# Patient Record
Sex: Male | Born: 1975 | Race: White | Hispanic: No | Marital: Married | Smoking: Former smoker
Health system: Southern US, Community
[De-identification: ages and names within clinical notes are randomized; demographics above are authoritative.]

## PROBLEM LIST (undated history)

## (undated) DIAGNOSIS — N289 Disorder of kidney and ureter, unspecified: Secondary | ICD-10-CM

## (undated) DIAGNOSIS — F419 Anxiety disorder, unspecified: Secondary | ICD-10-CM

## (undated) DIAGNOSIS — F32A Depression, unspecified: Secondary | ICD-10-CM

## (undated) HISTORY — DX: Anxiety disorder, unspecified: F41.9

## (undated) HISTORY — PX: LITHOTRIPSY: SUR834

## (undated) HISTORY — DX: Disorder of kidney and ureter, unspecified: N28.9

## (undated) HISTORY — DX: Depression, unspecified: F32.A

---

## 2019-07-02 ENCOUNTER — Other Ambulatory Visit: Payer: Self-pay | Admitting: *Deleted

## 2019-07-02 ENCOUNTER — Other Ambulatory Visit: Payer: Self-pay

## 2019-07-02 DIAGNOSIS — Z20822 Contact with and (suspected) exposure to covid-19: Secondary | ICD-10-CM

## 2019-07-04 LAB — NOVEL CORONAVIRUS, NAA: SARS-CoV-2, NAA: DETECTED — AB

## 2019-09-10 ENCOUNTER — Other Ambulatory Visit: Payer: Self-pay | Admitting: Pediatrics

## 2019-09-10 DIAGNOSIS — M542 Cervicalgia: Secondary | ICD-10-CM

## 2019-09-10 DIAGNOSIS — R221 Localized swelling, mass and lump, neck: Secondary | ICD-10-CM

## 2019-09-16 ENCOUNTER — Ambulatory Visit
Admission: RE | Admit: 2019-09-16 | Discharge: 2019-09-16 | Disposition: A | Discharge status: Home / Self Care | Payer: 59 | Source: Ambulatory Visit | Attending: Pediatrics | Admitting: Pediatrics

## 2019-09-16 ENCOUNTER — Other Ambulatory Visit: Payer: Self-pay

## 2019-09-16 DIAGNOSIS — R221 Localized swelling, mass and lump, neck: Secondary | ICD-10-CM | POA: Diagnosis present

## 2019-09-16 DIAGNOSIS — M542 Cervicalgia: Secondary | ICD-10-CM | POA: Diagnosis present

## 2019-10-23 ENCOUNTER — Telehealth: Payer: Self-pay | Admitting: Radiology

## 2019-10-23 NOTE — Telephone Encounter (Signed)
Order received 09-23-19 for ct brain with and without.  I have left two messages at the office to fax a corrected order.  No order has been received. The incorrect order was purged on 10-23-19.

## 2020-07-30 ENCOUNTER — Other Ambulatory Visit: Payer: Self-pay | Admitting: Pediatrics

## 2020-07-30 DIAGNOSIS — K760 Fatty (change of) liver, not elsewhere classified: Secondary | ICD-10-CM

## 2020-08-13 ENCOUNTER — Ambulatory Visit: Payer: 59

## 2020-08-27 ENCOUNTER — Ambulatory Visit
Admission: RE | Admit: 2020-08-27 | Discharge: 2020-08-27 | Disposition: A | Discharge status: Home / Self Care | Payer: 59 | Source: Ambulatory Visit | Attending: Pediatrics | Admitting: Pediatrics

## 2020-08-27 ENCOUNTER — Other Ambulatory Visit: Payer: Self-pay

## 2020-08-27 DIAGNOSIS — K760 Fatty (change of) liver, not elsewhere classified: Secondary | ICD-10-CM | POA: Insufficient documentation

## 2021-02-04 IMAGING — US US THYROID
1 series · 14 of 25 positions shown · non-contrast
Comparison: None.

CLINICAL DATA: Right neck pain, localized swelling/lump

EXAM:
THYROID ULTRASOUND
TECHNIQUE: Ultrasound examination of the thyroid gland and adjacent soft
tissues was performed.

[Series 1: us thyroid · 0.07mm/px · 14 of 52 slices shown]
[im 1/52]
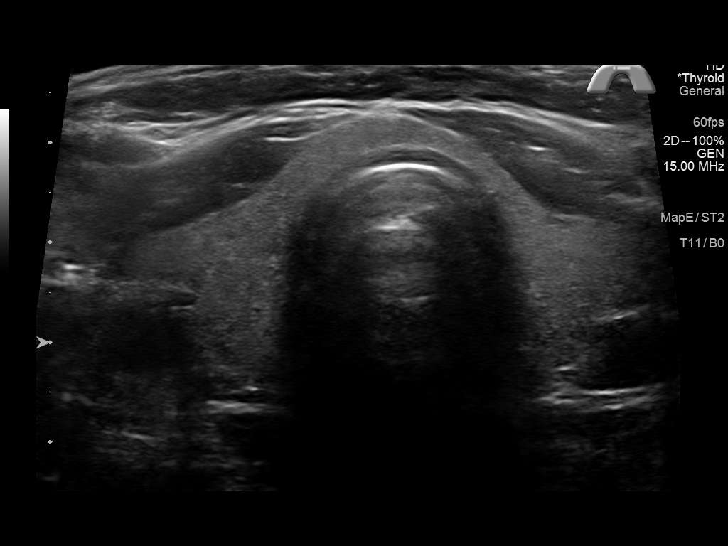
[im 5/52]
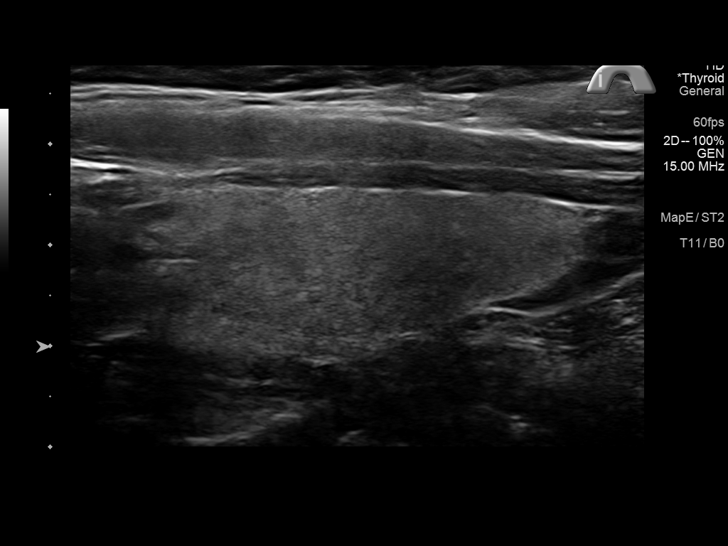
[im 9/52]
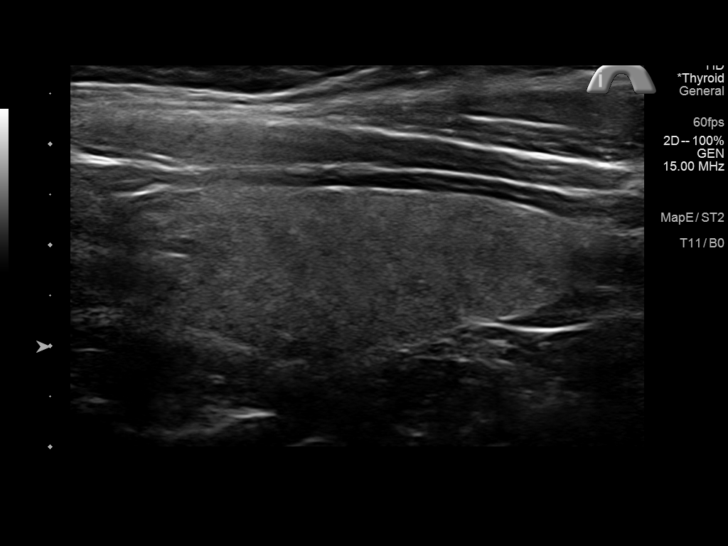
[im 13/52]
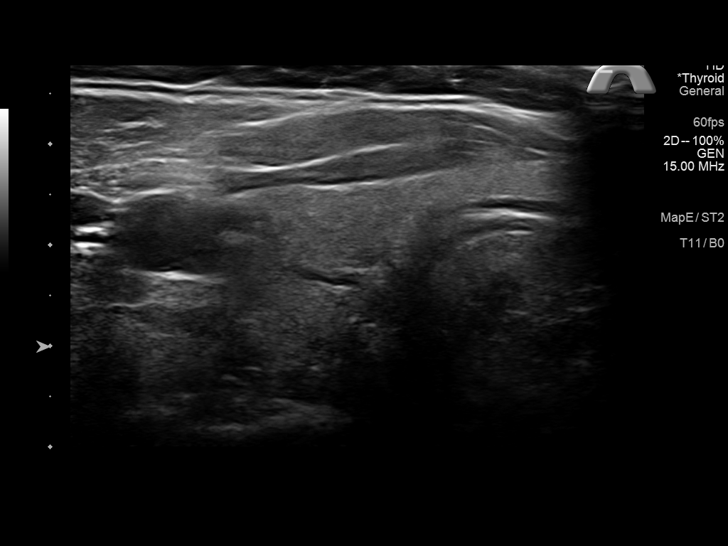
[im 18/52]
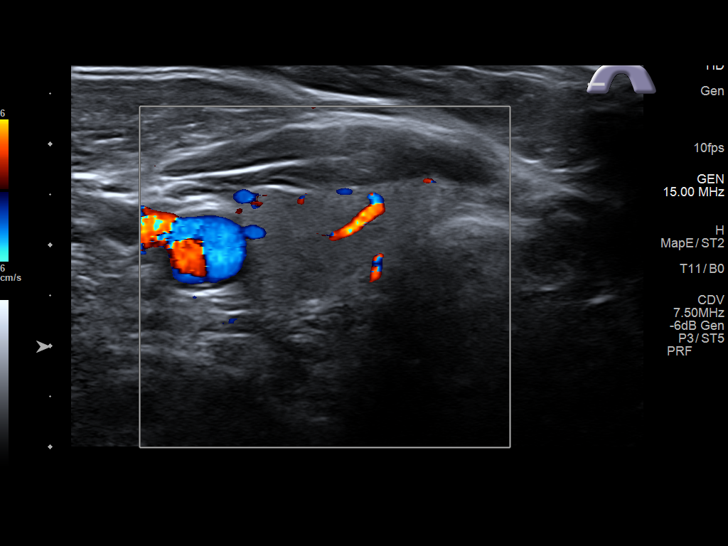
[im 20/52]
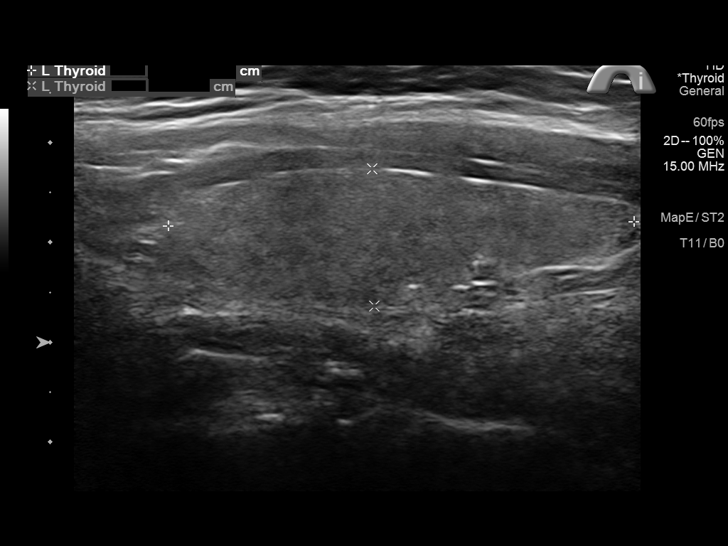
[im 24/52]
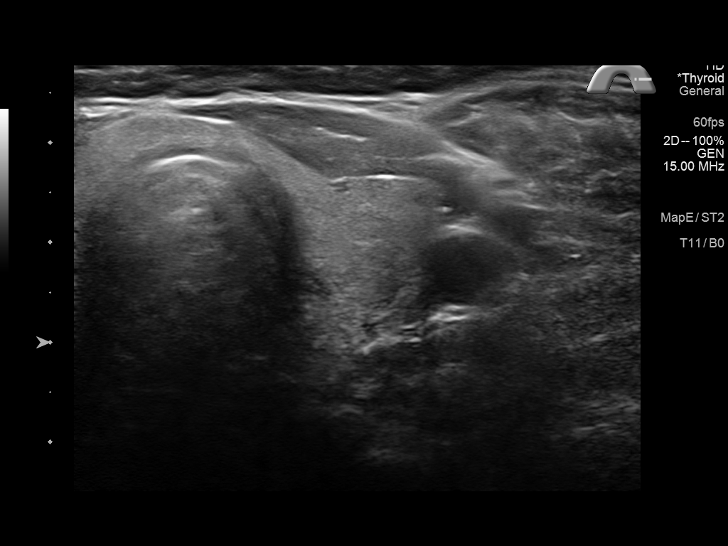
[im 28/52]
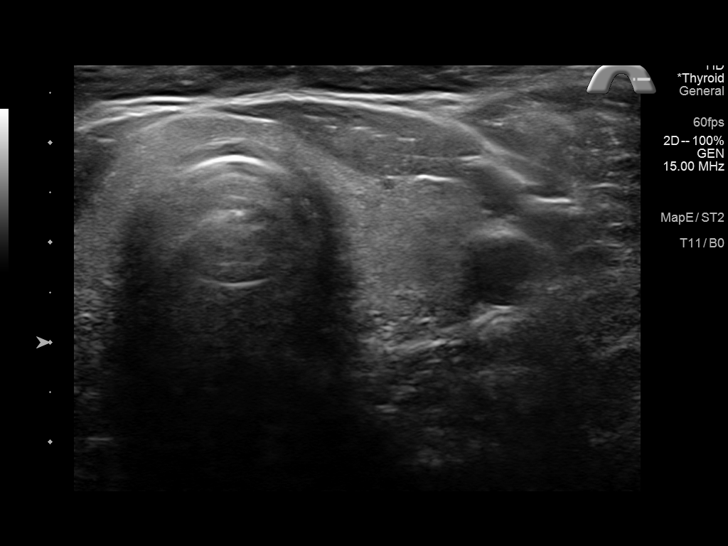
[im 32/52]
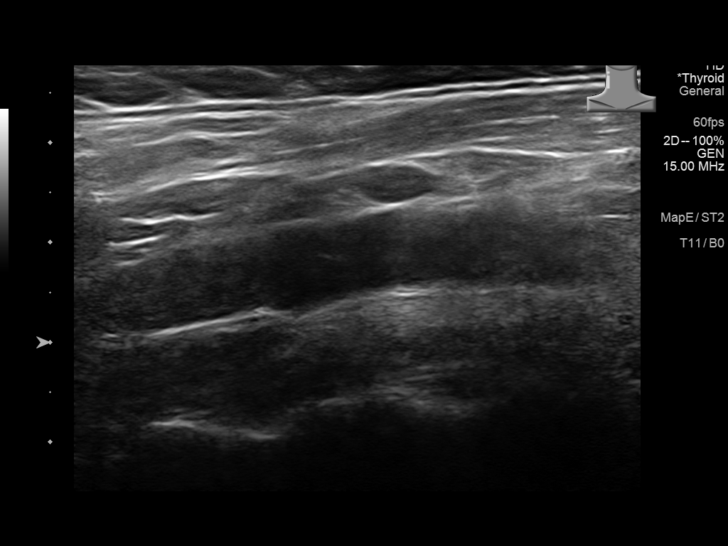
[im 35/52]
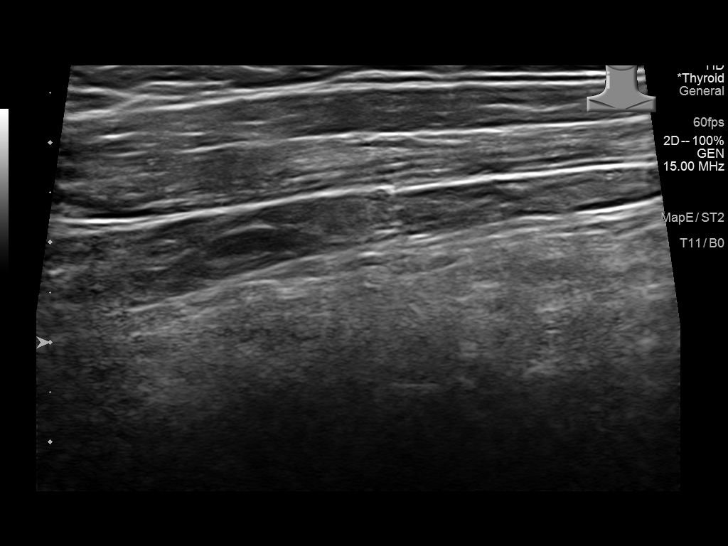
[im 39/52]
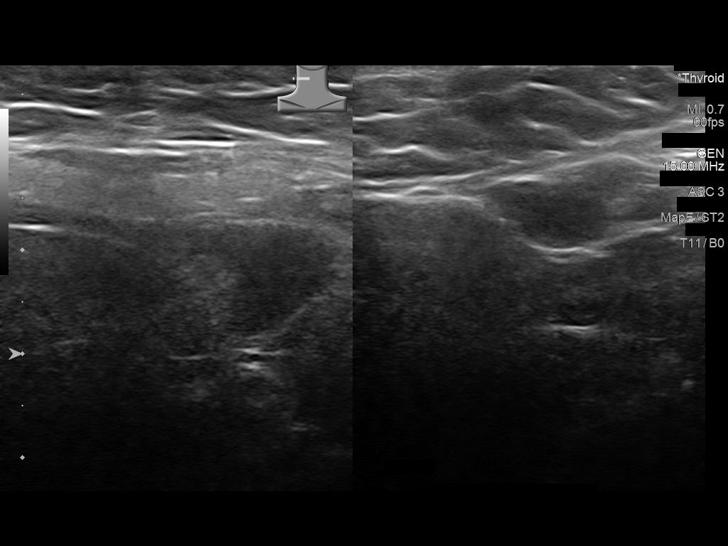
[im 43/52]
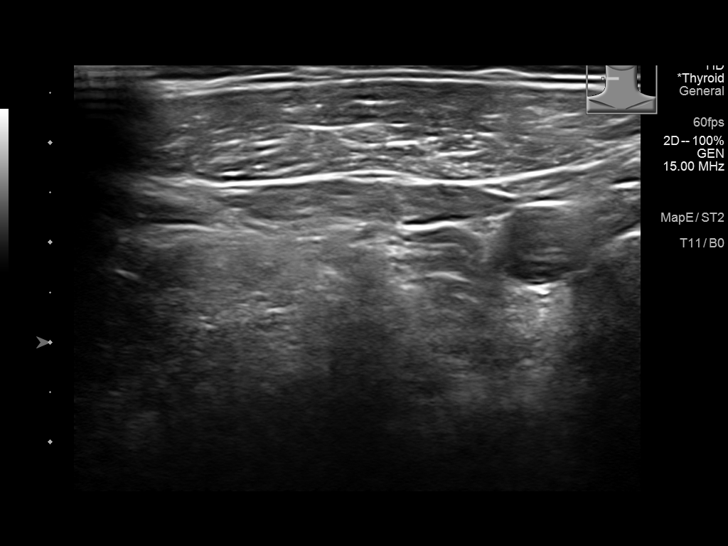
[im 47/52]
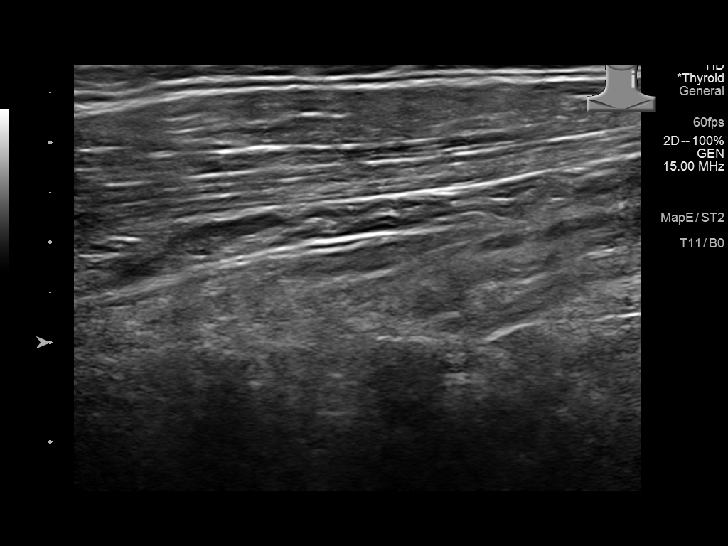
[im 52/52]
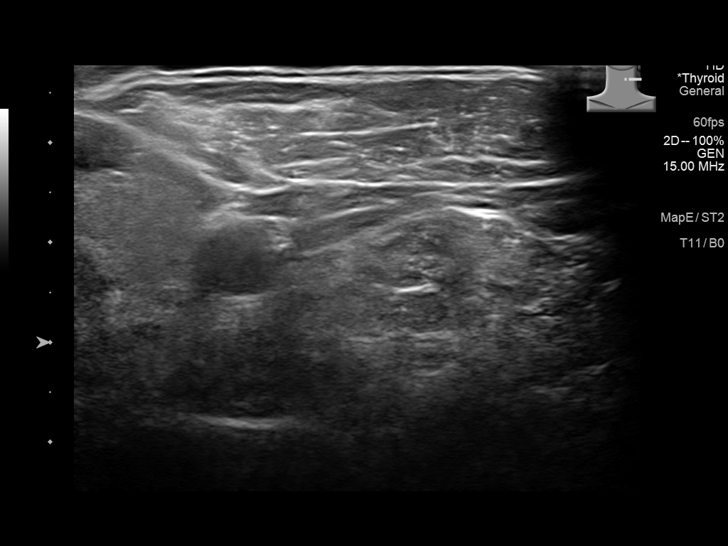

[14 of 25 positions shown; findings below may reference images not displayed]

FINDINGS: Parenchymal Echotexture: Normal

Isthmus: 0.3 cm thickness

Right lobe: 4.1 x 1.6 x 1.6 cm

Left lobe: 4.7 x 1.4 x 1.5 cm

_________________________________________________________

Estimated total number of nodules >/= 1 cm: 0

Number of spongiform nodules >/=  2 cm not described below (TR1): 0

Number of mixed cystic and solid nodules >/= 1.5 cm not described
below (TR2): 0

_________________________________________________________

No discrete nodules are seen within the thyroid gland.

Poorly visualized hypoechoic region in the right neck posterior to
the angle of the mandible estimated 2.5 x 1.1 cm. Limited images of
the contralateral neck are unremarkable.
IMPRESSION: 1. Normal thyroid.
2. Right neck finding may represent adenopathy (nonspecific) or
parotid mass. Consider CT neck with contrast for more complete
evaluation.

The above is in keeping with the ACR TI-RADS recommendations - [HOSPITAL] 3352;[DATE].

## 2022-07-01 ENCOUNTER — Ambulatory Visit: Admission: EM | Admit: 2022-07-01 | Discharge: 2022-07-01 | Discharge status: Against Medical Advice | Payer: 59

## 2022-07-01 ENCOUNTER — Encounter: Payer: Self-pay | Admitting: Emergency Medicine

## 2022-07-01 DIAGNOSIS — R079 Chest pain, unspecified: Secondary | ICD-10-CM

## 2022-07-01 DIAGNOSIS — F419 Anxiety disorder, unspecified: Secondary | ICD-10-CM

## 2022-07-01 NOTE — ED Notes (Signed)
Patient is being discharged from the Urgent Care and sent to the [facility] Emergency Department via private vehicle . Per Eusebio Friendly, PA, patient is in need of higher level of care due to chest pain. Patient is aware and verbalizes understanding of plan of care. Patient refused to go to ER by EMS.  Patient signed AMA before leaving UC. Vitals:   07/01/22 1734  BP: (!) 159/95  Pulse: 88  Resp: 15  Temp: 98.4 F (36.9 C)  SpO2: 98%

## 2022-07-01 NOTE — ED Provider Notes (Signed)
[facility]    CSN: [id number] Arrival date & time: 07/01/22  1722      History   Chief Complaint Chief Complaint  Patient presents with   Chest Pain    HPI Jimmy Giles is a 46 y.o. male presenting for evaluation after an episode of acute and "excruciating" chest pain.  Symptoms started about 45 minutes ago.  He reports waves of excruciating chest pain that would come and go every minute or 2 for about 20 minutes.  He says he has had much less pain over the past 20 to 25 minutes.  Now he says his pain is about a 3 out of 10.  It is to the right of the center of his chest.  It is constant now.  He reports nausea with the waves of excruciating pain.  He did not have and does not have any shortness of breath, palpitations, numbness, weakness, radiation of pain into his extremity.  He reports before the excruciating pain began he was having indigestion symptoms and a lot of belching and burping.  He reports a lot of anxiety recently and "marital troubles" for the past couple of months.  Reports he was outside doing yard work before onset of the chest pain.  Denies injury.  Patient has no history of heart disease.  Does have a history of tachycardia, anxiety, depression.  He reports that he thinks he could potentially have had a stroke about 4 months ago because now he has "stuttering when I get anxious" and "myoclonic jerking."  Reports getting a baby aspirin and Tums and says the pain is now minimal.  No other concerns.  HPI  Past Medical History:  Diagnosis Date   Anxiety    Depression    Renal disorder     There are no problems to display for this patient.   Past Surgical History:  Procedure Laterality Date   LITHOTRIPSY         Home Medications    Prior to Admission medications   Medication Sig Start Date End Date Taking? Authorizing Provider  Magnesium Citrate 125 MG CAPS daily. 02/06/20  Yes [provider]  traZODone (DESYREL) 50 MG tablet Take by  mouth. 04/08/22 04/08/23 Yes [provider]  FLUoxetine (PROZAC) 20 MG capsule Take 20 mg by mouth daily.    [provider]  FLUoxetine (PROZAC) 40 MG capsule Take 40 mg by mouth daily.    [provider]    Family History History reviewed. No pertinent family history.  Social History Social History   Tobacco Use   Smoking status: Former    Types: Cigarettes   Smokeless tobacco: Never  Vaping Use   Vaping Use: Never used  Substance Use Topics   Alcohol use: Yes   Drug use: Never     Allergies   Penicillin g   Review of Systems Review of Systems  Constitutional:  Negative for fatigue and fever.  HENT:  Negative for congestion.   Respiratory:  Negative for cough and shortness of breath.   Cardiovascular:  Positive for chest pain. Negative for palpitations and leg swelling.  Gastrointestinal:  Positive for nausea. Negative for abdominal pain and vomiting.  Musculoskeletal:  Negative for back pain.  Neurological:  Negative for dizziness, syncope, weakness, numbness and headaches.  Psychiatric/Behavioral:  The patient is nervous/anxious.      Physical Exam Triage Vital Signs ED Triage Vitals  Enc Vitals Group     BP 07/01/22 1734 (!) 159/95  Pulse Rate 07/01/22 1734 88     Resp 07/01/22 1734 15     Temp 07/01/22 1734 98.4 F (36.9 C)     Temp Source 07/01/22 1734 Oral     SpO2 07/01/22 1734 98 %     Weight 07/01/22 1730 200 lb (90.7 kg)     Height 07/01/22 1730 6' (1.829 m)     Head Circumference --      Peak Flow --      Pain Score 07/01/22 1729 1     Pain Loc --      Pain Edu? --      Excl. in GC? --    No data found.  Updated Vital Signs BP (!) 159/95 (BP Location: Right Arm)   Pulse 88   Temp 98.4 F (36.9 C) (Oral)   Resp 15   Ht 6' (1.829 m)   Wt 200 lb (90.7 kg)   SpO2 98%   BMI 27.12 kg/m    Physical Exam Vitals and nursing note reviewed.  Constitutional:      General: He is not in acute distress.     Appearance: Normal appearance. He is well-developed. He is not ill-appearing.  HENT:     Head: Normocephalic and atraumatic.     Nose: Nose normal.     Mouth/Throat:     Mouth: Mucous membranes are moist.     Pharynx: Oropharynx is clear.  Eyes:     General: No scleral icterus.    Conjunctiva/sclera: Conjunctivae normal.  Cardiovascular:     Rate and Rhythm: Normal rate and regular rhythm.     Heart sounds: Normal heart sounds.  Pulmonary:     Effort: Pulmonary effort is normal. No respiratory distress.     Breath sounds: Normal breath sounds.  Chest:     Chest wall: No tenderness.  Abdominal:     Palpations: Abdomen is soft.     Tenderness: There is no abdominal tenderness.  Musculoskeletal:     Cervical back: Neck supple.  Skin:    General: Skin is warm and dry.     Capillary Refill: Capillary refill takes less than 2 seconds.  Neurological:     General: No focal deficit present.     Mental Status: He is alert and oriented to person, place, and time. Mental status is at baseline.     Motor: No weakness.     Coordination: Coordination normal.     Gait: Gait normal.  Psychiatric:        Mood and Affect: Mood is anxious.        Speech: Speech is rapid and pressured.     Comments: Stuttering over words at times      UC Treatments / Results  Labs (all labs ordered are listed, but only abnormal results are displayed) Labs Reviewed - No data to display  EKG   Radiology No results found.  Procedures ED EKG  Date/Time: 07/01/2022 6:22 PM  Performed by: Shirlee Latch, PA-C Authorized by: Shirlee Latch, PA-C   Previous ECG:    Previous ECG:  Unavailable Interpretation:    Interpretation: normal   Rate:    ECG rate:  80   ECG rate assessment: normal   Rhythm:    Rhythm: sinus rhythm   Ectopy:    Ectopy: none   QRS:    QRS axis:  Normal   QRS intervals:  Normal   QRS conduction: normal   ST segments:    ST segments:  Normal T waves:    T waves:  normal    (including critical care time)  Medications Ordered in UC Medications - No data to display  Initial Impression / Assessment and Plan / UC Course  I have reviewed the triage vital signs and the nursing notes.  Pertinent labs & imaging results that were available during my care of the patient were reviewed by me and considered in my medical decision making (see chart for details).   46 year old male presents for evaluation of episode of chest pain that has been ongoing for the last 45 minutes.  The first 20 to 25 minutes were associated with excruciating waves of pain that would come and go and were associated with nausea.  No breathing difficulty, palpitations.  He reports a lot of anxiety and "marital troubles" recently.  He was also active in his yard before onset of symptoms.  He has no known history of heart disease.  BP elevated at 159/95.  Other vitals normal and stable.  He is in no acute distress.  He is anxious appearing, stuttering over his words and speech is rapid and pressured.  Normal HEENT exam.  Chest clear to auscultation heart regular rate and rhythm.  No abdominal tenderness.  No tenderness palpation of his chest.  EKG is normal.  Normal sinus rhythm and regular rate of 80 bpm.  Discussed EKG results.  Advised him that his EKG is normal but I am concerned about his description of his chest pain.  Even though it is better now it is still concerning.  Advised that I do recommend further workup in the emergency department to include labs and cardiac enzymes as well as repeat EKG/telemetry.  Patient is agreeable to go to emergency department.  Declines EMS.  He seems a little reluctant to go to the emergency department.  I discussed the risks of not going to emergency department especially if he is having an acute cardiac event which I advised I cannot completely rule out.  He signed an AMA.  Leaving in stable condition.   Final Clinical Impressions(s) / UC Diagnoses    Final diagnoses:  Chest pain, unspecified type     Discharge Instructions      You have been advised to follow up immediately in the emergency department for concerning signs.symptoms. If you declined EMS transport, please have a family member take you directly to the ED at this time. Do not delay. Based on concerns about condition, if you do not follow up in th e ED, you may risk poor outcomes including worsening of condition, delayed treatment and potentially life threatening issues. If you have declined to go to the ED at this time, you should call your PCP immediately to set up a follow up appointment.  Go to ED for red flag symptoms, including; fevers you cannot reduce with Tylenol/Motrin, severe headaches, vision changes, numbness/weakness in part of the body, lethargy, confusion, intractable vomiting, severe dehydration, chest pain, breathing difficulty, severe persistent abdominal or pelvic pain, signs of severe infection (increased redness, swelling of an area), feeling faint or passing out, dizziness, etc. You should especially go to the ED for sudden acute worsening of condition if you do not elect to go at this time.    ED Prescriptions   None    PDMP not reviewed this encounter.   Shirlee Latch, PA-C 07/01/22 1829

## 2022-07-01 NOTE — ED Triage Notes (Signed)
Patient c/o chest pain about 20 min.  Patient reports indigestion prior to the chest pain.  Patient states that he was outside pulling some weeds when the pain started.  Patient reports some SOB.

## 2022-07-01 NOTE — Discharge Instructions (Signed)
